# Patient Record
Sex: Male | Born: 1935 | Race: Black or African American | Hispanic: No
Health system: Southern US, Community
[De-identification: ages and names within clinical notes are randomized; demographics above are authoritative.]

---

## 2019-01-25 ENCOUNTER — Inpatient Hospital Stay
Admission: AD | Admit: 2019-01-25 | Payer: Self-pay | Source: Other Acute Inpatient Hospital | Admitting: Internal Medicine

## 2019-01-25 LAB — TSH: TSH: 1.63 (ref 0.41–5.90)

## 2019-01-25 LAB — HEMOGLOBIN A1C: Hemoglobin A1C: 8

## 2019-01-25 LAB — BASIC METABOLIC PANEL
BUN: 25 — AB (ref 4–21)
Creatinine: 0.8 (ref 0.6–1.3)

## 2019-03-17 ENCOUNTER — Ambulatory Visit (INDEPENDENT_AMBULATORY_CARE_PROVIDER_SITE_OTHER): Payer: Medicare Other | Admitting: "Endocrinology

## 2019-03-17 ENCOUNTER — Other Ambulatory Visit: Payer: Self-pay

## 2019-03-17 ENCOUNTER — Encounter: Payer: Self-pay | Admitting: "Endocrinology

## 2019-03-17 VITALS — BP 137/86 | HR 89 | Wt 154.0 lb

## 2019-03-17 DIAGNOSIS — E042 Nontoxic multinodular goiter: Secondary | ICD-10-CM | POA: Diagnosis not present

## 2019-03-17 NOTE — Progress Notes (Signed)
Endocrinology Consult Note                                            03/17/2019, 9:49 PM   Subjective:    Patient ID: Mitchell Woods, male    DOB: 24-Jul-1932, PCP Mitchell Woods   History reviewed. No pertinent past medical history. History reviewed. No pertinent surgical history. Social History   Socioeconomic History  . Marital status: Divorced    Spouse name: Not on file  . Number of children: Not on file  . Years of education: Not on file  . Highest education level: Not on file  Occupational History  . Not on file  Tobacco Use  . Smoking status: Not on file  . Smokeless tobacco: Never Used  Substance and Sexual Activity  . Alcohol use: Not on file  . Drug use: Not on file  . Sexual activity: Not on file  Other Topics Concern  . Not on file  Social History Narrative  . Not on file   Social Determinants of Health   Financial Resource Strain:   . Difficulty of Paying Living Expenses: Not on file  Food Insecurity:   . Worried About Programme researcher, broadcasting/film/video in the Last Year: Not on file  . Ran Out of Food in the Last Year: Not on file  Transportation Needs:   . Lack of Transportation (Medical): Not on file  . Lack of Transportation (Non-Medical): Not on file  Physical Activity:   . Days of Exercise per Week: Not on file  . Minutes of Exercise per Session: Not on file  Stress:   . Feeling of Stress : Not on file  Social Connections:   . Frequency of Communication with Friends and Family: Not on file  . Frequency of Social Gatherings with Friends and Family: Not on file  . Attends Religious Services: Not on file  . Active Member of Clubs or Organizations: Not on file  . Attends Banker Meetings: Not on file  . Marital Status: Not on file   History reviewed. No pertinent family history. Outpatient Encounter Medications as of 03/17/2019  Medication Sig  . hydrochlorothiazide (HYDRODIURIL) 25 MG tablet daily.  Marland Kitchen aspirin 81 MG EC tablet Take by mouth  daily.  . enalapril (VASOTEC) 20 MG tablet Take 20 mg by mouth 2 (two) times daily.  . finasteride (PROSCAR) 5 MG tablet Take 5 mg by mouth daily.  Marland Kitchen glimepiride (AMARYL) 2 MG tablet Take 2 mg by mouth 2 (two) times daily.  . metFORMIN (GLUCOPHAGE-XR) 500 MG 24 hr tablet Take 1,000 mg by mouth 2 (two) times daily.  . tamsulosin (FLOMAX) 0.4 MG CAPS capsule Take 0.4 mg by mouth daily.   No facility-administered encounter medications on file as of 03/17/2019.   ALLERGIES: Not on File  VACCINATION STATUS:  There is no immunization history on file for this patient.  HPI Mitchell Woods is 86 y.o. male who presents today with a medical history as above. he is being seen in consultation for multinodular goiter requested by Mitchell Woods.  He is accompanied by his son.  He denies any prior history of thyroid dysfunction.  He was undergoing carotid ultrasound on January 25, 2019 when he was found incidental finding of thyroid nodules.  Subsequent thyroid ultrasound on February 11, 2019 revealed right lobe of  thyroid measuring 5.8 cm with multiple nodules largest being 3.9 cm, left lobe measuring 5.6 cm with multiple nodules 2 of which measure 2 x 1 cm and 2.8 cm. He denies dysphagia, shortness of breath breath, nor voice change.  -He denies palpitations, tremors, nor heat/cold intolerance.  His previsit thyroid function tests are within normal limits.   He does not have family history of thyroid dysfunction or thyroid malignancy.  Review of Systems  Constitutional: no recent weight gain/loss, no fatigue, no subjective hyperthermia, no subjective hypothermia Eyes: no blurry vision, no xerophthalmia ENT: no sore throat, no nodules palpated in throat, no dysphagia/odynophagia, no hoarseness Cardiovascular: no Chest Pain, no Shortness of Breath, no palpitations, no leg swelling Respiratory: no cough, no shortness of breath Gastrointestinal: no Nausea/Vomiting/Diarhhea Musculoskeletal: no muscle/joint  aches, + disequilibrium walking with a cane. Skin: no rashes Neurological: no tremors, no numbness, no tingling, no dizziness Psychiatric: no depression, no anxiety  Objective:    Today's Vitals   03/17/19 2144  BP: 137/86  Pulse: 89  Weight: 154 lb (69.9 kg)    Physical Exam  Constitutional: not in acute distress, normal state of mind, + disequilibrium, walks with a cane. Eyes: PERRLA, EOMI, no exophthalmos ENT: moist mucous membranes, no gross thyromegaly, no gross cervical lymphadenopathy Cardiovascular: normal precordial activity, Regular Rate and Rhythm, no Murmur/Rubs/Gallops Respiratory:  adequate breathing efforts, no gross chest deformity, Clear to auscultation bilaterally Gastrointestinal: abdomen soft, Non -tender, No distension, Bowel Sounds present, no gross organomegaly Musculoskeletal: no gross deformities, strength intact in all four extremities Skin: moist, warm, no rashes Neurological: no tremor with outstretched hands, Deep tendon reflexes normal in bilateral lower extremities.  Recent Results (from the past 2160 hour(s))  Basic metabolic panel     Status: Abnormal   Collection Time: 01/25/19 12:00 AM  Result Value Ref Range   BUN 25 (A) 4 - 21   Creatinine 0.8 0.6 - 1.3  Hemoglobin A1c     Status: None   Collection Time: 01/25/19 12:00 AM  Result Value Ref Range   Hemoglobin A1C 8   TSH     Status: None   Collection Time: 01/25/19 12:00 AM  Result Value Ref Range   TSH 1.63 0.41 - 5.90    Comment: ft4 1.06      Assessment & Plan:   1. Multinodular goiter   - Mitchell Woods  is being seen at a kind request of Mitchell Hong, Woods. - I have reviewed his available thyroid records and clinically evaluated the patient. - Based on these reviews, he has multinodular goiter with 3 nodules reported to be mildly to moderately suspicious.  He will be sent for fine-needle aspiration of 1 nodule on each lobe of the thyroid.  He will return with biopsy results in 15  to 20 days for evaluation.  If he is found to have abnormal cytology, will be considered for surgery.  His recent previsit thyroid function tests are within normal limits, does not require intervention with thyroid hormone nor antithyroid medications.  - I did not initiate any new prescriptions today. - he is advised to maintain close follow up with Mitchell Hong, Woods for primary care needs.  He has type 2 diabetes with most recent A1c of 8%, on Metformin and glimepiride.   - Time spent with the patient: 45 minutes, of which >50% was spent in  counseling him about his multinodular goiter and the rest in obtaining information about his symptoms, reviewing his previous labs/studies ( including  abstractions from other facilities),  evaluations, and treatments,  and developing a plan to confirm diagnosis and long term treatment based on the latest standards of care/guidelines; and documenting his care.  Mitchell Woods participated in the discussions, expressed understanding, and voiced agreement with the above plans.  All questions were answered to his satisfaction. he is encouraged to contact clinic should he have any questions or concerns prior to his return visit.  Follow up plan: Return in about 3 weeks (around 04/07/2019) for Follow up with Biopsy Results.   Marquis Lunch, Woods [facility] [address] [place], [place] [address] Phone: [phone]  Fax: [phone]     03/17/2019, 9:49 PM  This note was partially dictated with voice recognition software. Similar sounding words can be transcribed inadequately or may not  be corrected upon review.

## 2019-04-08 ENCOUNTER — Encounter: Payer: Self-pay | Admitting: "Endocrinology

## 2019-04-14 ENCOUNTER — Encounter: Payer: Self-pay | Admitting: "Endocrinology

## 2019-04-14 ENCOUNTER — Ambulatory Visit (INDEPENDENT_AMBULATORY_CARE_PROVIDER_SITE_OTHER): Payer: Medicare Other | Admitting: "Endocrinology

## 2019-04-14 ENCOUNTER — Other Ambulatory Visit: Payer: Self-pay

## 2019-04-14 DIAGNOSIS — E042 Nontoxic multinodular goiter: Secondary | ICD-10-CM

## 2019-04-14 NOTE — Progress Notes (Signed)
04/14/2019, 2:08 PM                                Endocrinology Telehealth Visit Follow up Note -During COVID -19 Pandemic  I connected with Mitchell Woods on 04/14/2019   by telephone and verified that I am speaking with the correct person using two identifiers. Mitchell Woods, [birth year]. he has verbally consented to this visit. All issues noted in this document were discussed and addressed. The format was not optimal for physical exam.   Subjective:    Patient ID: Mitchell Woods, male    DOB: [birth year], PCP Kathlee Nations, MD   No past medical history on file. No past surgical history on file. Social History   Socioeconomic History  . Marital status: Divorced    Spouse name: Not on file  . Number of children: Not on file  . Years of education: Not on file  . Highest education level: Not on file  Occupational History  . Not on file  Tobacco Use  . Smoking status: Not on file  . Smokeless tobacco: Never Used  Substance and Sexual Activity  . Alcohol use: Not on file  . Drug use: Not on file  . Sexual activity: Not on file  Other Topics Concern  . Not on file  Social History Narrative  . Not on file   Social Determinants of Health   Financial Resource Strain:   . Difficulty of Paying Living Expenses: Not on file  Food Insecurity:   . Worried About Programme researcher, broadcasting/film/video in the Last Year: Not on file  . Ran Out of Food in the Last Year: Not on file  Transportation Needs:   . Lack of Transportation (Medical): Not on file  . Lack of Transportation (Non-Medical): Not on file  Physical Activity:   . Days of Exercise per Week: Not on file  . Minutes of Exercise per Session: Not on file  Stress:   . Feeling of Stress : Not on file  Social Connections:   . Frequency of Communication with Friends and Family: Not on file  . Frequency of Social Gatherings with Friends and Family: Not on file  . Attends Religious Services: Not on file  . Active  Member of Clubs or Organizations: Not on file  . Attends Banker Meetings: Not on file  . Marital Status: Not on file   No family history on file. Outpatient Encounter Medications as of 04/14/2019  Medication Sig  . aspirin 81 MG EC tablet Take by mouth daily.  . enalapril (VASOTEC) 20 MG tablet Take 20 mg by mouth 2 (two) times daily.  . finasteride (PROSCAR) 5 MG tablet Take 5 mg by mouth daily.  Marland Kitchen glimepiride (AMARYL) 2 MG tablet Take 2 mg by mouth 2 (two) times daily.  . hydrochlorothiazide (HYDRODIURIL) 25 MG tablet daily.  . metFORMIN (GLUCOPHAGE-XR) 500 MG 24 hr tablet Take 1,000 mg by mouth 2 (two) times daily.  . tamsulosin (FLOMAX) 0.4 MG CAPS capsule Take 0.4 mg by mouth daily.   No facility-administered encounter medications on file as of 04/14/2019.   ALLERGIES: Not on File  VACCINATION STATUS:  There is  no immunization history on file for this patient.  HPI Mitchell Woods is 86 y.o. male who presents today with a medical history as above. he is being engaged in telehealth via telephone after he was seen in consultation for multinodular goiter.     PMD:  Eber Hong, MD.   He underwent fine-needle aspiration of thyroid nodules on April 08, 2019.  The biopsy sample from the right side was insufficient for diagnosis.  Report is not available on the left thyroid lobe nodule.    He denies any prior history of thyroid dysfunction.   He was undergoing carotid ultrasound on January 25, 2019 when he was found incidental finding of thyroid nodules.  Subsequent thyroid ultrasound on February 11, 2019 revealed right lobe of thyroid measuring 5.8 cm with multiple nodules largest being 3.9 cm, left lobe measuring 5.6 cm with multiple nodules 2 of which measure 2 x 1 cm and 2.8 cm. He denies dysphagia, shortness of breath breath, nor voice change.  -He denies palpitations, tremors, nor heat/cold intolerance.  His previsit thyroid function tests are within normal limits.    He does not have family history of thyroid dysfunction or thyroid malignancy.  Review of Systems Limited as above.  Objective:    There were no vitals filed for this visit.    Recent Results (from the past 2160 hour(s))  Basic metabolic panel     Status: Abnormal   Collection Time: 01/25/19 12:00 AM  Result Value Ref Range   BUN 25 (A) 4 - 21   Creatinine 0.8 0.6 - 1.3  Hemoglobin A1c     Status: None   Collection Time: 01/25/19 12:00 AM  Result Value Ref Range   Hemoglobin A1C 8   TSH     Status: None   Collection Time: 01/25/19 12:00 AM  Result Value Ref Range   TSH 1.63 0.41 - 5.90    Comment: ft4 1.06   Cytopathology report April 08, 2019 right thyroid lobe: Scant follicular epithelium present (Bethesda category 1)   Assessment & Plan:   1. Multinodular goiter  -He did have insufficient material for diagnosis on right lobe of his thyroid.  We will request for report on the left thyroid lobe biopsy results.   If he is found to have abnormal cytology, will be considered for surgery.  His recent previsit thyroid function tests are within normal limits, does not require intervention with thyroid hormone nor antithyroid medications.  - I did not initiate any new prescriptions today. - he is advised to maintain close follow up with Eber Hong, MD for primary care needs.  He has type 2 diabetes with most recent A1c of 8%, on Metformin and glimepiride.      - Time spent on this patient care encounter:  25 minutes of which 50% was spent in  counseling and the rest reviewing  his current and  previous labs / studies and medications  doses and developing a plan for long term care. Mitchell Woods  participated in the discussions, expressed understanding, and voiced agreement with the above plans.  All questions were answered to his satisfaction. he is encouraged to contact clinic should he have any questions or concerns prior to his return visit.   Follow up plan: No  follow-ups on file.   Glade Lloyd, MD [facility] [address] [place], [place] [address] Phone: [phone]  Fax: [phone]     04/14/2019, 2:08 PM  This note was partially  dictated with voice recognition software. Similar sounding words can be transcribed inadequately or may not  be corrected upon review.

## 2019-04-15 ENCOUNTER — Telehealth: Payer: Self-pay | Admitting: "Endocrinology

## 2019-04-15 NOTE — Telephone Encounter (Signed)
Dr Fransico Him, Did you talk to this patient yesterday, I tried to print his AVS and mail his next appt but there is not anything available.

## 2019-04-28 ENCOUNTER — Other Ambulatory Visit: Payer: Self-pay | Admitting: "Endocrinology

## 2019-04-28 DIAGNOSIS — E042 Nontoxic multinodular goiter: Secondary | ICD-10-CM

## 2019-04-28 NOTE — Telephone Encounter (Signed)
Please advise 

## 2019-04-28 NOTE — Telephone Encounter (Signed)
I re-ordered his FNA at [facility].

## 2019-04-28 NOTE — Telephone Encounter (Signed)
Request sent to [facility] and April Pait to be scheduled.

## 2019-05-17 NOTE — Telephone Encounter (Signed)
Sent a message to April Pait stating pt and family has not heard anything about scheduling biopsy. Pt's son made aware.

## 2019-05-17 NOTE — Telephone Encounter (Signed)
Patient's son, Tomoki Lucken left a VM that he has not heard from [facility]king to get his dad schedule for the biopsy. Please advise.   [phone]

## 2019-05-18 ENCOUNTER — Other Ambulatory Visit: Payer: Self-pay | Admitting: "Endocrinology

## 2019-05-18 DIAGNOSIS — E042 Nontoxic multinodular goiter: Secondary | ICD-10-CM

## 2019-05-19 ENCOUNTER — Encounter (HOSPITAL_COMMUNITY): Payer: Self-pay

## 2019-05-19 NOTE — Progress Notes (Signed)
Mitchell Woods Male, 86 y.o., [birth year] MRN:  [id number] Phone:  [phone] Judie Petit) PCP:  Kathlee Nations, MD Primary Cvg:  None Next Appt With Radiology ([facility]) 05/26/2019 at 9:00 AM  RE: Biopsy Received: Yesterday Message Contents  Oley Balm, MD  Laney Potash   [place] FNA x2  Previous cyto inadequate @ [facility]    [facility]   Previous Messages  ----- Message -----  From: Cory Munch  Sent: 05/18/2019  1:13 PM EDT  To: Ir Procedure Requests  Subject: Biopsy                      Procedure Requested: [place] FNA    Reason for Procedure: repeat FNA, 3.9 cms nodule on right lobe and 2.8 cm nodule on left lobe    Provider Requesting: Roma Kayser  Provider Telephone:  [phone]    Other Info:

## 2019-05-26 ENCOUNTER — Ambulatory Visit (HOSPITAL_COMMUNITY)
Admission: RE | Admit: 2019-05-26 | Discharge: 2019-05-26 | Disposition: A | Discharge status: Home / Self Care | Payer: Medicare Other | Source: Ambulatory Visit | Attending: "Endocrinology | Admitting: "Endocrinology

## 2019-05-26 ENCOUNTER — Encounter (HOSPITAL_COMMUNITY): Payer: Self-pay

## 2019-05-26 ENCOUNTER — Other Ambulatory Visit: Payer: Self-pay

## 2019-05-26 DIAGNOSIS — E042 Nontoxic multinodular goiter: Secondary | ICD-10-CM | POA: Insufficient documentation

## 2019-05-26 MED ORDER — LIDOCAINE HCL (PF) 2 % IJ SOLN
INTRAMUSCULAR | Status: AC
  Filled 2019-05-26: qty 20

## 2019-05-26 NOTE — Procedures (Signed)
PreOperative Dx: Multiple thyroid nodules Postoperative Dx: Multiple thyroid nodules Procedure:   US guided FNA of bilateral thyroid nodules Radiologist:  Tyron Russell Anesthesia:  4 ml of 2% lidocaine Specimen:  FNA x 5 of RT mid and FNA x 5 of LT inferior thyroid nodules  EBL:   < 1 ml Complications: None

## 2019-05-27 LAB — CYTOLOGY - NON PAP

## 2020-10-16 ENCOUNTER — Ambulatory Visit: Payer: Medicare Other | Admitting: Urology

## 2020-10-25 DEATH — deceased

## 2021-10-15 IMAGING — US US BIOPSY FNA W/ IMAGING
2 series · 13 of 25 positions shown · non-contrast
Comparison: 02/11/2019 thyroid ultrasound

MEDICATIONS:
None

COMPLICATIONS:
None immediate.

INDICATION: Indeterminate thyroid nodules; nondiagnostic prior biopsies

EXAM:
ULTRASOUND GUIDED FINE NEEDLE ASPIRATION OF INDETERMINATE THYROID
NODULE
ULTRASOUND GUIDED FINE NEEDLE ASPIRATION OF ADD'L INDETERMINATE
THYROID NODULE
TECHNIQUE: Informed written consent was obtained from the patient after a
discussion of the risks, benefits and alternatives to treatment.
Questions regarding the procedure were encouraged and answered. A
timeout was performed prior to the initiation of the procedure.

[Series 1: us biopsy fna w/ imaging · 25 acquisitions, 7 frames shown (1 of 2)]
[im 1/25]
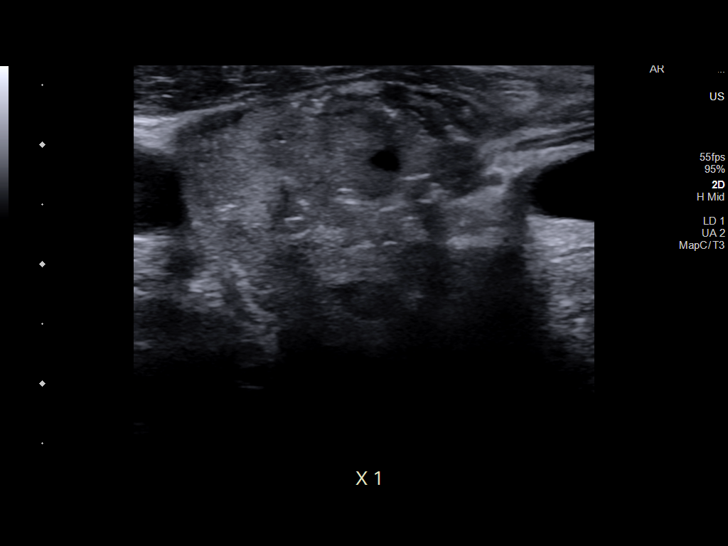
[im 5/25]
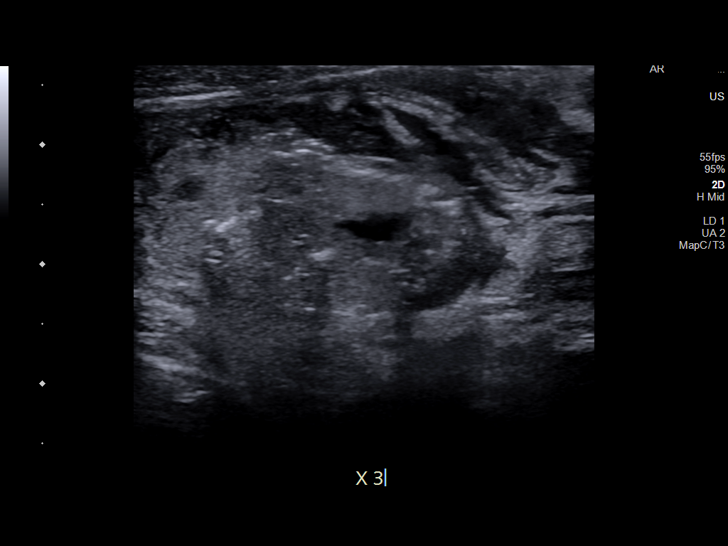
[im 9/25]
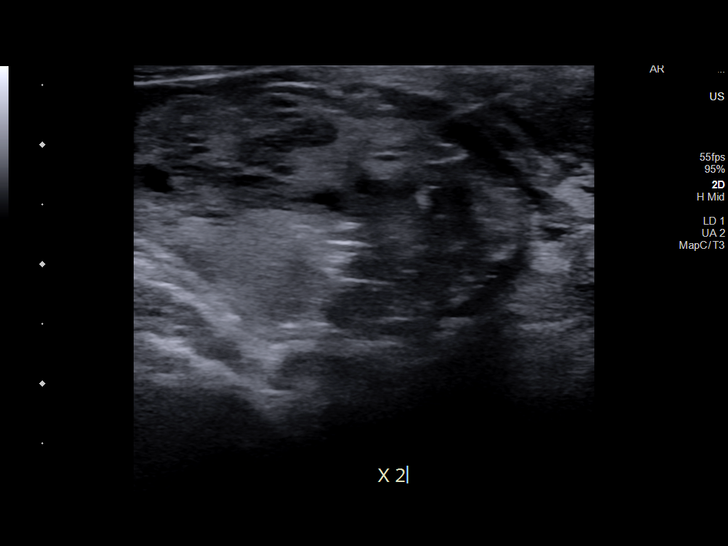
[im 13/25]
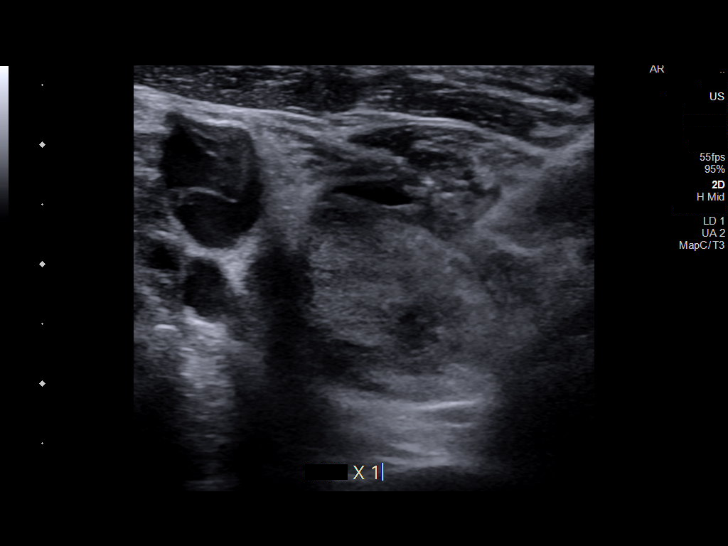
[im 17/25]
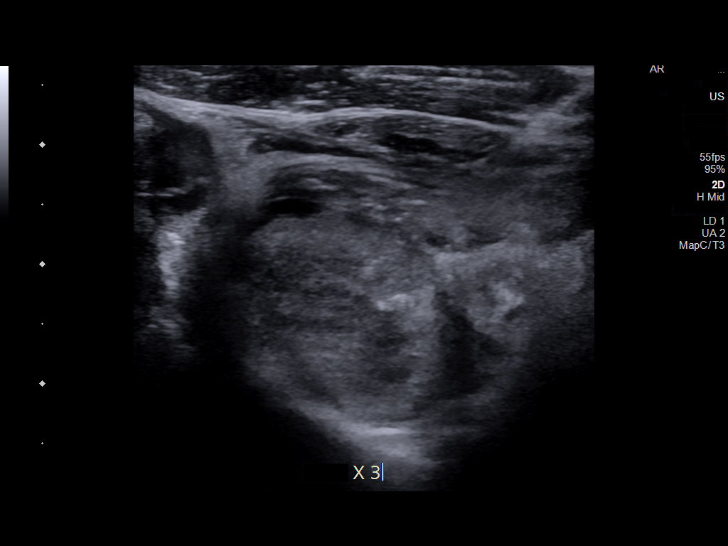
[im 21/25]
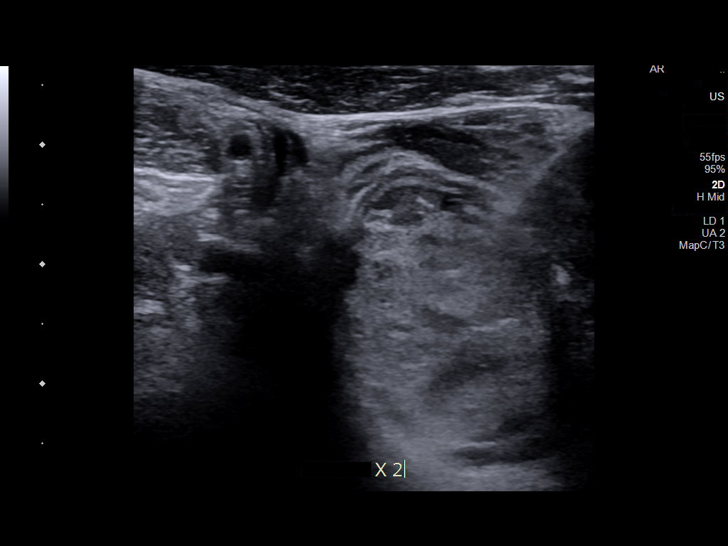
[im 25/25]
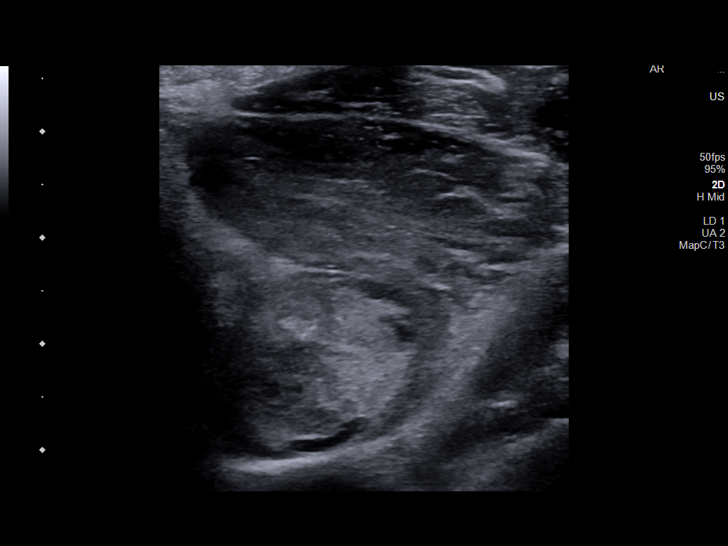

[Series 1: us biopsy fna w/ imaging · 6 of 25 slices shown (2 of 2)]
[im 3/25]
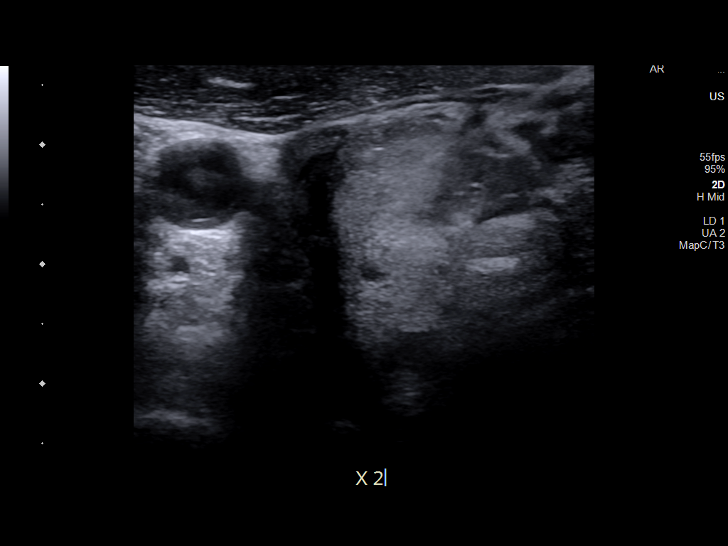
[im 7/25]
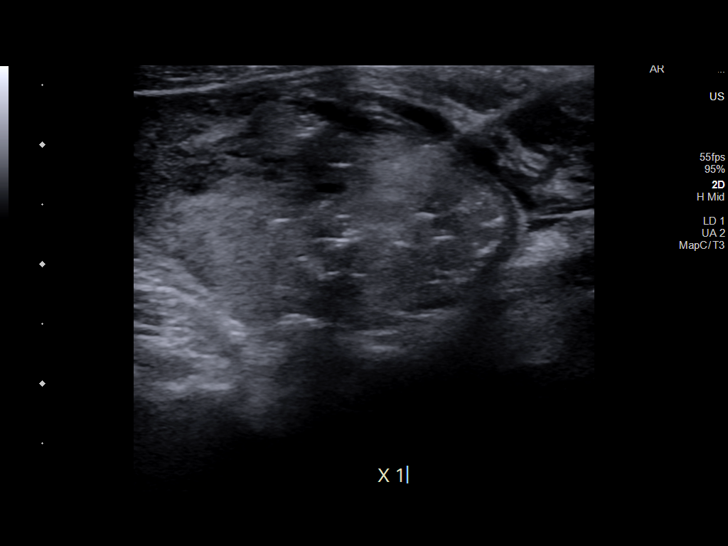
[im 11/25]
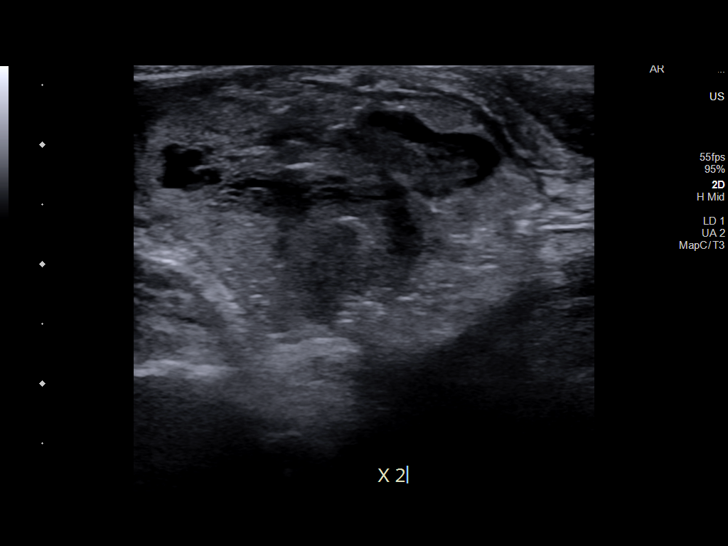
[im 16/25]
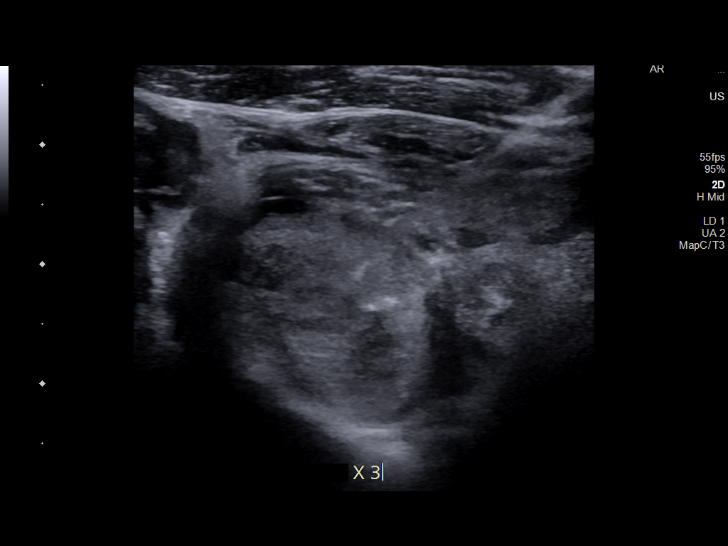
[im 20/25]
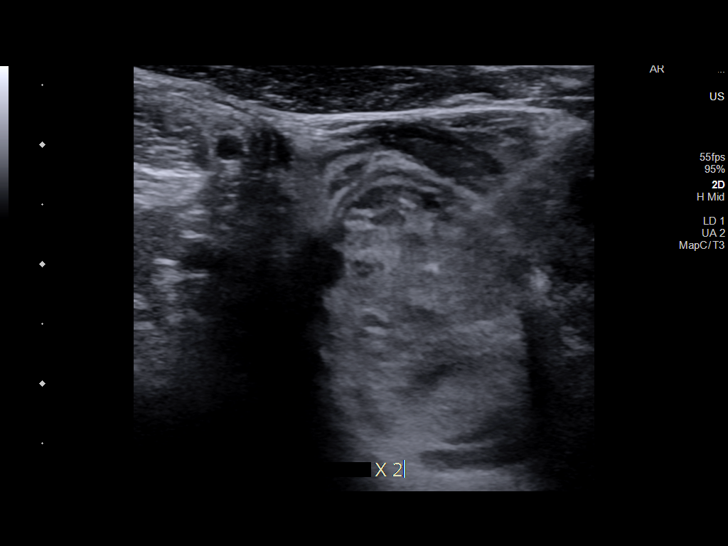
[im 25/25]
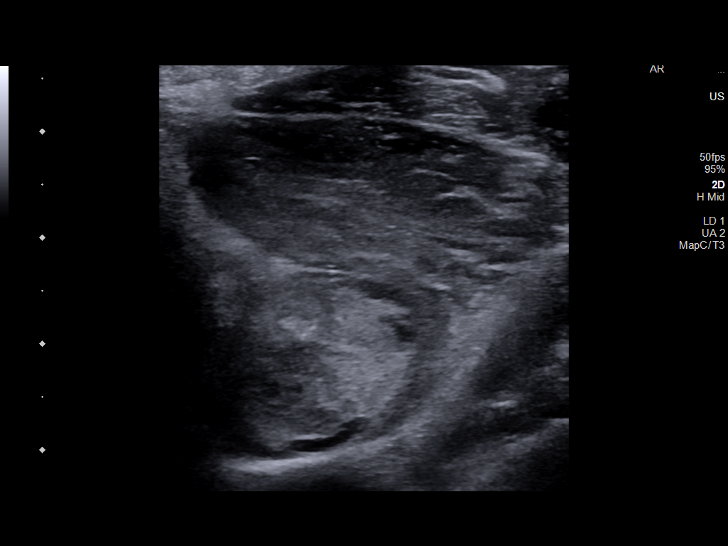

[13 of 25 positions shown; findings below may reference images not displayed]

Pre-procedural ultrasound scanning demonstrated unchanged size and
appearance of the indeterminate nodules within the mid RIGHT lobe
and inferior LEFT lobe

The procedure was planned. The neck was prepped in the usual sterile
fashion, and a sterile drape was applied covering the operative
field. A timeout was performed prior to the initiation of the
procedure. Local anesthesia was provided with 1% lidocaine.

Under direct ultrasound guidance, 3 FNA biopsies were performed of
the mid RIGHT lobe 3.9 cm nodule with a 25 gauge needle. Multiple
ultrasound images were saved for procedural documentation purposes.
Two additional FNA biopsies were performed for Afirma testing, if
required. The samples were prepared and submitted to pathology.

Under direct ultrasound guidance, 3 FNA biopsies were performed of
the LEFT inferior 2.1 cm nodule with a 25 gauge needle. Multiple
ultrasound images were saved for procedural documentation purposes.
Two additional FNA biopsies were performed for Afirma testing, if
required. The samples were prepared and submitted to pathology.

Limited post procedural scanning was negative for hematoma or
additional complication. Dressings were placed. The patient
tolerated the above procedures procedure well without immediate
postprocedural complication.
FINDINGS: Nodule reference number based on prior diagnostic ultrasound: 1

Maximum size: 3.9 cm

Location: Right; Mid

ACR TI-RADS risk category: TR3 (3 points)

Reason for biopsy: meets ACR TI-RADS criteria

_________________________________________________________

Nodule reference number based on prior diagnostic ultrasound: 5

Maximum size: 2.1 cm

Location: Left; Inferior

ACR TI-RADS risk category: TR5 (>/= 7 points)

Reason for biopsy: meets ACR TI-RADS criteria

Ultrasound imaging confirms appropriate placement of the needles
within the thyroid nodule.
IMPRESSION: 1. Technically successful ultrasound guided fine needle aspiration
of RIGHT mid thyroid nodule
2. Technically successful ultrasound guided fine needle aspiration
of LEFT inferior thyroid nodule

## 2021-10-15 IMAGING — US US FNA BIOPSY THYROID 1ST LESION
2 series · 13 of 25 positions shown · non-contrast
Comparison: 02/11/2019 thyroid ultrasound

MEDICATIONS:
None

COMPLICATIONS:
None immediate.

INDICATION: Indeterminate thyroid nodules; nondiagnostic prior biopsies

EXAM:
ULTRASOUND GUIDED FINE NEEDLE ASPIRATION OF INDETERMINATE THYROID
NODULE
ULTRASOUND GUIDED FINE NEEDLE ASPIRATION OF ADD'L INDETERMINATE
THYROID NODULE
TECHNIQUE: Informed written consent was obtained from the patient after a
discussion of the risks, benefits and alternatives to treatment.
Questions regarding the procedure were encouraged and answered. A
timeout was performed prior to the initiation of the procedure.

[Series 1: us fna biopsy thyroid 1st lesion · 25 acquisitions, 7 frames shown (1 of 2)]
[im 1/25]
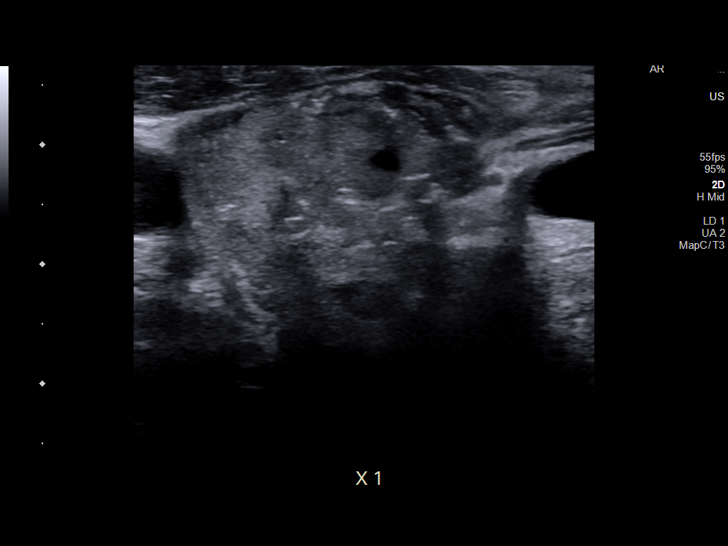
[im 5/25]
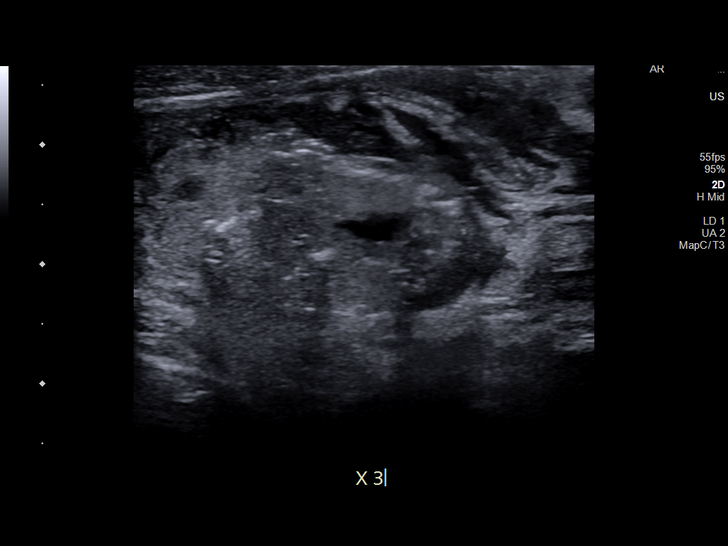
[im 9/25]
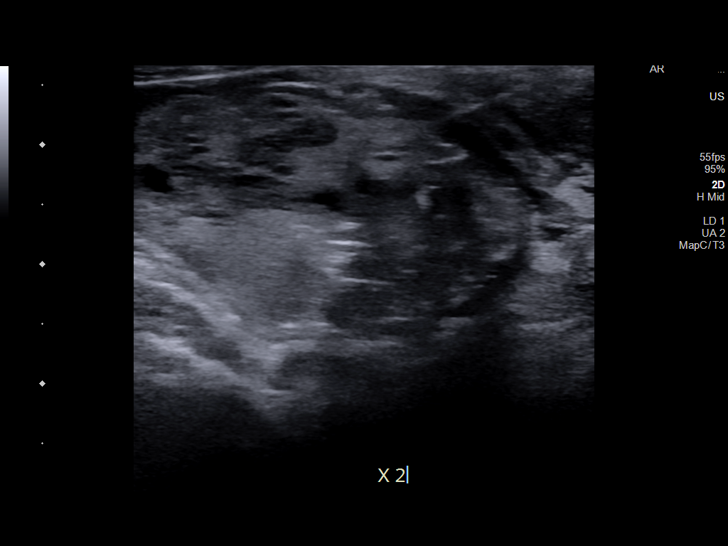
[im 13/25]
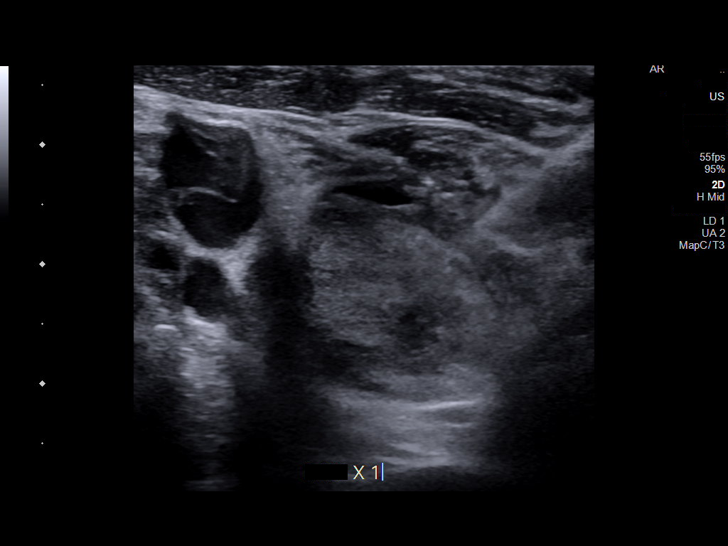
[im 17/25]
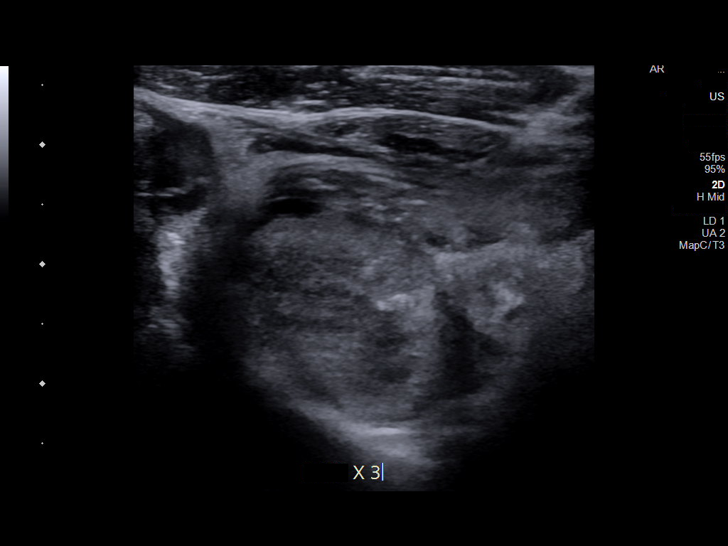
[im 21/25]
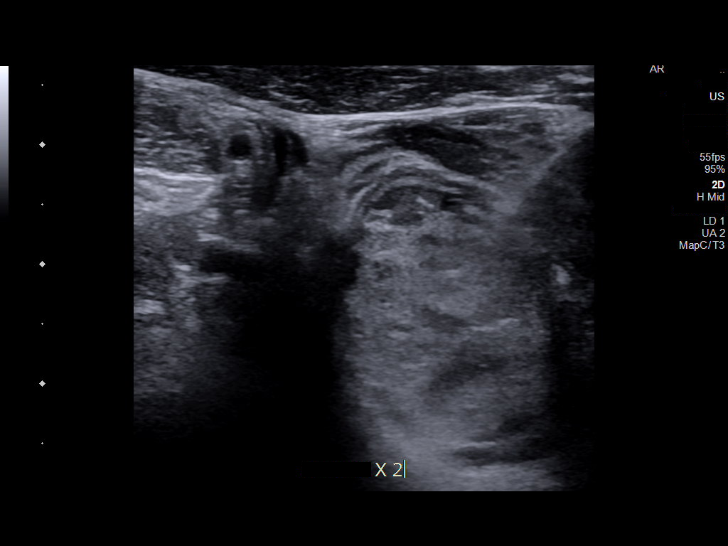
[im 25/25]
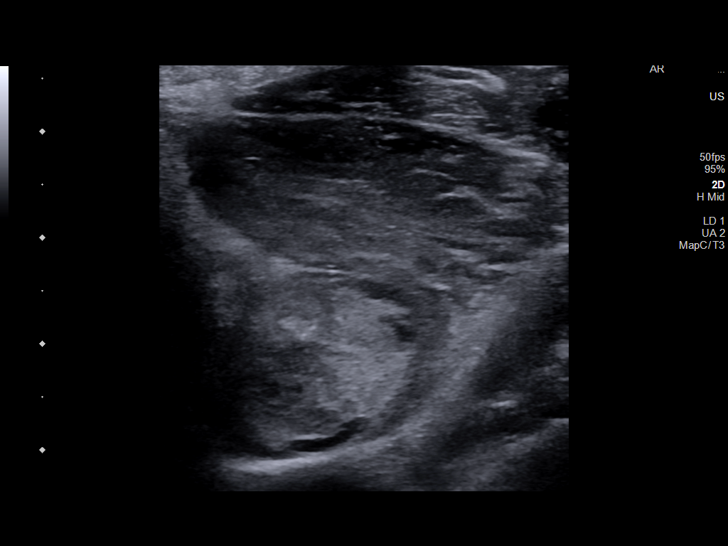

[Series 1: us fna biopsy thyroid 1st lesion · 6 of 25 slices shown (2 of 2)]
[im 3/25]
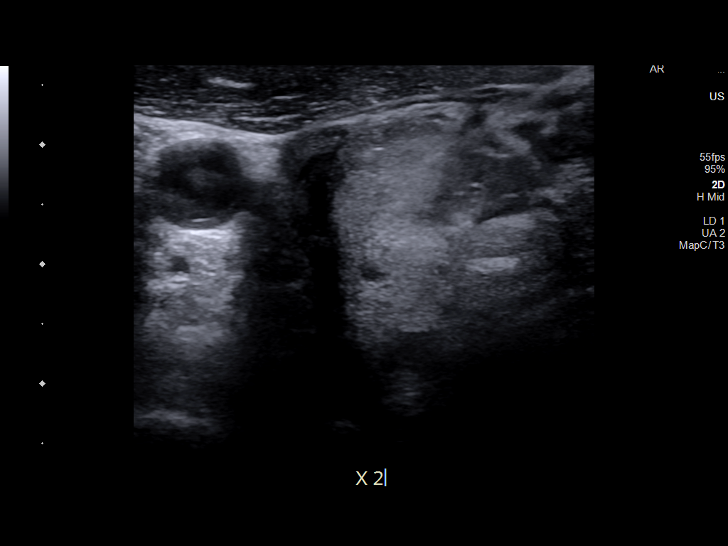
[im 7/25]
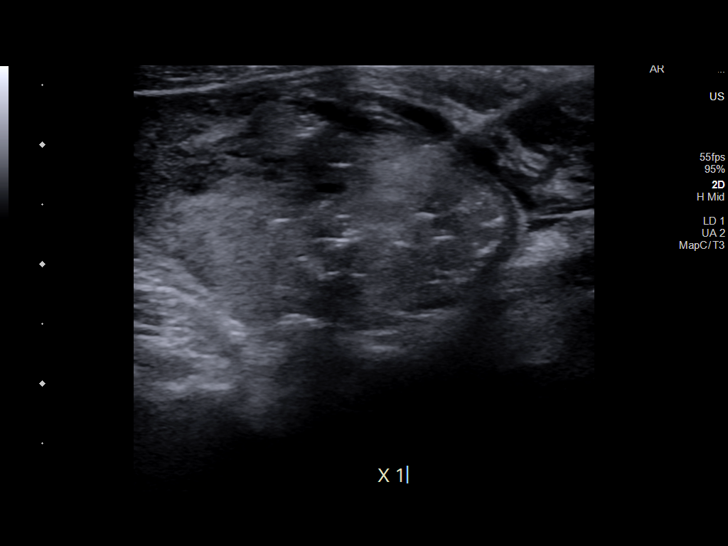
[im 11/25]
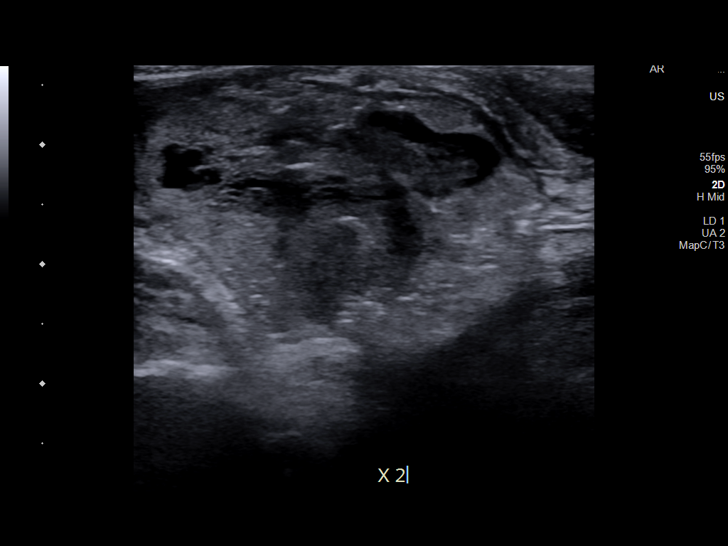
[im 16/25]
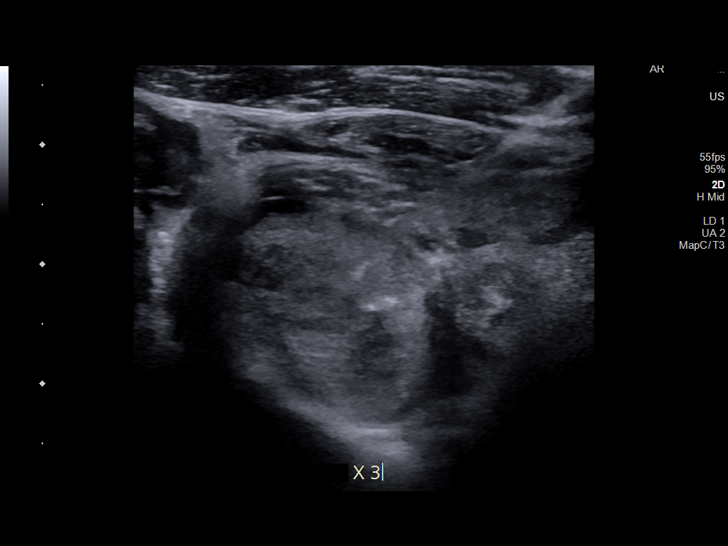
[im 20/25]
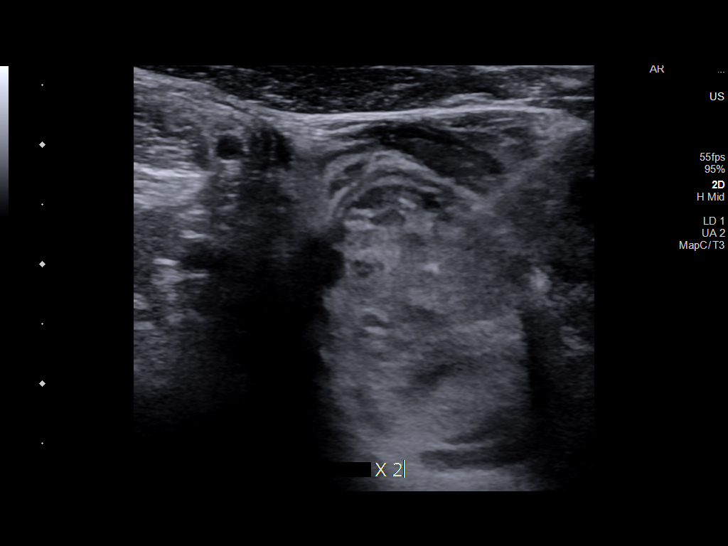
[im 25/25]
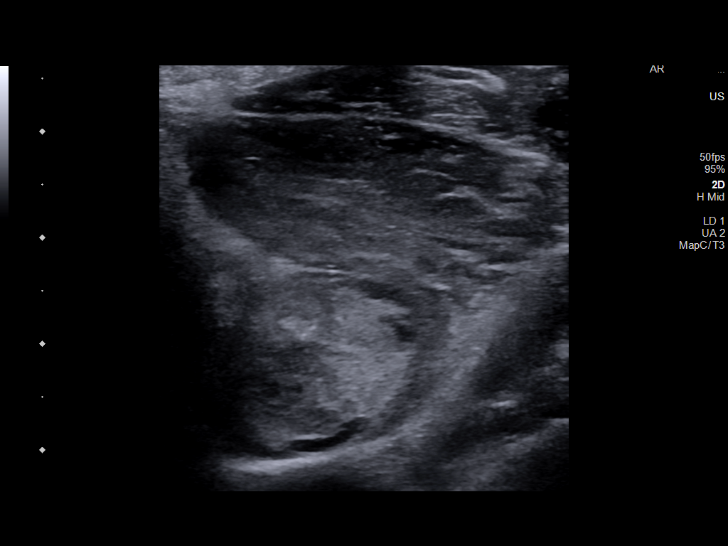

[13 of 25 positions shown; findings below may reference images not displayed]

Pre-procedural ultrasound scanning demonstrated unchanged size and
appearance of the indeterminate nodules within the mid RIGHT lobe
and inferior LEFT lobe

The procedure was planned. The neck was prepped in the usual sterile
fashion, and a sterile drape was applied covering the operative
field. A timeout was performed prior to the initiation of the
procedure. Local anesthesia was provided with 1% lidocaine.

Under direct ultrasound guidance, 3 FNA biopsies were performed of
the mid RIGHT lobe 3.9 cm nodule with a 25 gauge needle. Multiple
ultrasound images were saved for procedural documentation purposes.
Two additional FNA biopsies were performed for Afirma testing, if
required. The samples were prepared and submitted to pathology.

Under direct ultrasound guidance, 3 FNA biopsies were performed of
the LEFT inferior 2.1 cm nodule with a 25 gauge needle. Multiple
ultrasound images were saved for procedural documentation purposes.
Two additional FNA biopsies were performed for Afirma testing, if
required. The samples were prepared and submitted to pathology.

Limited post procedural scanning was negative for hematoma or
additional complication. Dressings were placed. The patient
tolerated the above procedures procedure well without immediate
postprocedural complication.
FINDINGS: Nodule reference number based on prior diagnostic ultrasound: 1

Maximum size: 3.9 cm

Location: Right; Mid

ACR TI-RADS risk category: TR3 (3 points)

Reason for biopsy: meets ACR TI-RADS criteria

_________________________________________________________

Nodule reference number based on prior diagnostic ultrasound: 5

Maximum size: 2.1 cm

Location: Left; Inferior

ACR TI-RADS risk category: TR5 (>/= 7 points)

Reason for biopsy: meets ACR TI-RADS criteria

Ultrasound imaging confirms appropriate placement of the needles
within the thyroid nodule.
IMPRESSION: 1. Technically successful ultrasound guided fine needle aspiration
of RIGHT mid thyroid nodule
2. Technically successful ultrasound guided fine needle aspiration
of LEFT inferior thyroid nodule
